# Patient Record
Sex: Male | Born: 1990 | Race: White | Hispanic: No | Marital: Married | Smoking: Never smoker
Health system: Southern US, Community
[De-identification: ages and names within clinical notes are randomized; demographics above are authoritative.]

---
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
## 2010-06-17 ENCOUNTER — Emergency Department (HOSPITAL_BASED_OUTPATIENT_CLINIC_OR_DEPARTMENT_OTHER): Admission: EM | Admit: 2010-06-17 | Discharge: 2010-06-17 | Payer: Self-pay | Admitting: Emergency Medicine

## 2017-12-05 DIAGNOSIS — M67431 Ganglion, right wrist: Secondary | ICD-10-CM | POA: Diagnosis not present

## 2017-12-16 DIAGNOSIS — M25731 Osteophyte, right wrist: Secondary | ICD-10-CM | POA: Diagnosis not present

## 2017-12-16 DIAGNOSIS — R52 Pain, unspecified: Secondary | ICD-10-CM | POA: Diagnosis not present

## 2017-12-25 DIAGNOSIS — J069 Acute upper respiratory infection, unspecified: Secondary | ICD-10-CM | POA: Diagnosis not present

## 2018-01-30 DIAGNOSIS — H9190 Unspecified hearing loss, unspecified ear: Secondary | ICD-10-CM | POA: Diagnosis not present

## 2018-03-23 ENCOUNTER — Emergency Department (HOSPITAL_COMMUNITY)
Admission: EM | Admit: 2018-03-23 | Discharge: 2018-03-24 | Disposition: A | Discharge status: Home / Self Care | Payer: 59 | Attending: Emergency Medicine | Admitting: Emergency Medicine

## 2018-03-23 ENCOUNTER — Encounter (HOSPITAL_COMMUNITY): Payer: Self-pay | Admitting: *Deleted

## 2018-03-23 DIAGNOSIS — Y9389 Activity, other specified: Secondary | ICD-10-CM | POA: Diagnosis not present

## 2018-03-23 DIAGNOSIS — S61213A Laceration without foreign body of left middle finger without damage to nail, initial encounter: Secondary | ICD-10-CM

## 2018-03-23 DIAGNOSIS — Y999 Unspecified external cause status: Secondary | ICD-10-CM | POA: Insufficient documentation

## 2018-03-23 DIAGNOSIS — Y9289 Other specified places as the place of occurrence of the external cause: Secondary | ICD-10-CM | POA: Diagnosis not present

## 2018-03-23 DIAGNOSIS — W260XXA Contact with knife, initial encounter: Secondary | ICD-10-CM | POA: Diagnosis not present

## 2018-03-23 MED ORDER — TETANUS-DIPHTH-ACELL PERTUSSIS 5-2.5-18.5 LF-MCG/0.5 IM SUSP
0.5000 mL | Freq: Once | INTRAMUSCULAR | Status: AC
  Administered 2018-03-23: 0.5 mL via INTRAMUSCULAR
  Filled 2018-03-23: qty 0.5

## 2018-03-23 MED ORDER — LIDOCAINE HCL (PF) 1 % IJ SOLN
5.0000 mL | Freq: Once | INTRAMUSCULAR | Status: AC
  Administered 2018-03-23: 5 mL via INTRADERMAL
  Filled 2018-03-23: qty 30

## 2018-03-23 NOTE — ED Notes (Signed)
Bed: WA06 Expected date:  Expected time:  Means of arrival:  Comments: 

## 2018-03-23 NOTE — ED Triage Notes (Signed)
Pt attempting to open a package with pocketknife & presents with laceration to left hand, 3rd digit, bleeding controlled.

## 2018-03-23 NOTE — ED Provider Notes (Signed)
[facility]-EMERGENCY DEPT Provider Note   CSN: [id number] Arrival date & time: 03/23/18  2108     History   Chief Complaint Chief Complaint  Patient presents with  . Laceration    left hand, 3rd digit    HPI NORAH FICK is a 27 y.o. male.  The history is provided by the patient.  Laceration       27 y.o. male here with laceration to left middle finger.  States he was trying to open a package with a pocket knife and slipped cutting the distal end of his finger.  Bleeding controlled on arrival.  Denies any numbness or weakness of affected digit.  Date of last tetanus unknown.  History reviewed. No pertinent past medical history.  There are no active problems to display for this patient.   History reviewed. No pertinent surgical history.      Home Medications    Prior to Admission medications   Not on File    Family History No family history on file.  Social History Social History   Tobacco Use  . Smoking status: Never Smoker  Substance Use Topics  . Alcohol use: Yes    Comment: ocassionaly  . Drug use: Not Currently     Allergies   Patient has no allergy information on record.   Review of Systems Review of Systems  Skin: Positive for wound.  All other systems reviewed and are negative.    Physical Exam Updated Vital Signs BP 121/67 (BP Location: Right Arm)   Pulse 66   Temp 97.9 F (36.6 C) (Oral)   Resp 14   Ht 5\' 7"  (1.702 m)   Wt 54.4 kg (120 lb)   SpO2 100%   BMI 18.79 kg/m   Physical Exam  Constitutional: He is oriented to person, place, and time. He appears well-developed and well-nourished.  HENT:  Head: Normocephalic and atraumatic.  Mouth/Throat: Oropharynx is clear and moist.  Eyes: Pupils are equal, round, and reactive to light. Conjunctivae and EOM are normal.  Neck: Normal range of motion.  Cardiovascular: Normal rate, regular rhythm and normal heart sounds.  Pulmonary/Chest: Effort  normal and breath sounds normal.  Abdominal: Soft. Bowel sounds are normal.  Musculoskeletal: Normal range of motion.  3cm laceration to distal tip of left middle finger; no nail involvement; full ROM of finger; no deep tissue, vessel, or tendon involvement; normal cap refill and distal sensation  Neurological: He is alert and oriented to person, place, and time.  Skin: Skin is warm and dry.  Psychiatric: He has a normal mood and affect.  Nursing note and vitals reviewed.    ED Treatments / Results  Labs (all labs ordered are listed, but only abnormal results are displayed) Labs Reviewed - No data to display  EKG None  Radiology No results found.  Procedures Procedures (including critical care time)  LACERATION REPAIR Performed by: Garlon Hatchet Authorized by: Garlon Hatchet Consent: Verbal consent obtained. Risks and benefits: risks, benefits and alternatives were discussed Consent given by: patient Patient identity confirmed: provided demographic data Prepped and Draped in normal sterile fashion Wound explored  Laceration Location: distal left middle finger2  Laceration Length: 3 cm  No Foreign Bodies seen or palpated  Anesthesia: local infiltration  Local anesthetic: lidocaine 1% without epinephrine  Anesthetic total: 3 ml  Irrigation method: syringe Amount of cleaning: standard  Skin closure: 5-0 prolene  Number of sutures: 3  Technique: simple interrupted  Patient tolerance: Patient  tolerated the procedure well with no immediate complications.   Medications Ordered in ED Medications  lidocaine (PF) (XYLOCAINE) 1 % injection 5 mL (5 mLs Intradermal Given by Other 03/23/18 2359)  Tdap (BOOSTRIX) injection 0.5 mL (0.5 mLs Intramuscular Given 03/23/18 2359)     Initial Impression / Assessment and Plan / ED Course  I have reviewed the triage vital signs and the nursing notes.  Pertinent labs & imaging results that were available during my care of the  patient were reviewed by me and considered in my medical decision making (see chart for details).  27 year old male here with 3 cm laceration of left distal middle finger from pocket knife.  Bleeding well controlled on arrival.  Finger remains neurovascularly intact with full range of motion.  There is no evidence of deep tissue, vessel, or tendon involvement.  Tetanus was updated.  Laceration repaired as above, patient tolerated well.  Discussed home wound care.  Follow-up for suture removal in 1 week.  Return precautions given for any new/acute changes.  Final Clinical Impressions(s) / ED Diagnoses   Final diagnoses:  Laceration of left middle finger without foreign body without damage to nail, initial encounter    ED Discharge Orders    None       Garlon HatchetSanders, Aryan Bello M, PA-C 03/24/18 0021    Gilda CreasePollina, Christopher J, MD 03/24/18 [phone]

## 2018-03-24 ENCOUNTER — Other Ambulatory Visit: Payer: Self-pay

## 2018-03-24 NOTE — Discharge Instructions (Signed)
Keep sutures clean and dry. Follow-up with your primary care doctor in 1 week to have them removed. Return here for any new/acute changes.

## 2018-05-26 DIAGNOSIS — M546 Pain in thoracic spine: Secondary | ICD-10-CM | POA: Diagnosis not present

## 2018-06-02 DIAGNOSIS — M79604 Pain in right leg: Secondary | ICD-10-CM | POA: Diagnosis not present

## 2018-06-02 DIAGNOSIS — M545 Low back pain: Secondary | ICD-10-CM | POA: Diagnosis not present

## 2018-06-02 DIAGNOSIS — M546 Pain in thoracic spine: Secondary | ICD-10-CM | POA: Diagnosis not present

## 2018-06-04 DIAGNOSIS — M79604 Pain in right leg: Secondary | ICD-10-CM | POA: Diagnosis not present

## 2018-06-04 DIAGNOSIS — M545 Low back pain: Secondary | ICD-10-CM | POA: Diagnosis not present

## 2018-06-04 DIAGNOSIS — M546 Pain in thoracic spine: Secondary | ICD-10-CM | POA: Diagnosis not present

## 2018-06-09 DIAGNOSIS — M79604 Pain in right leg: Secondary | ICD-10-CM | POA: Diagnosis not present

## 2018-06-09 DIAGNOSIS — M545 Low back pain: Secondary | ICD-10-CM | POA: Diagnosis not present

## 2018-06-09 DIAGNOSIS — M546 Pain in thoracic spine: Secondary | ICD-10-CM | POA: Diagnosis not present

## 2018-09-26 DIAGNOSIS — R05 Cough: Secondary | ICD-10-CM | POA: Diagnosis not present

## 2018-11-18 DIAGNOSIS — J1189 Influenza due to unidentified influenza virus with other manifestations: Secondary | ICD-10-CM | POA: Diagnosis not present

## 2020-05-10 ENCOUNTER — Emergency Department (HOSPITAL_BASED_OUTPATIENT_CLINIC_OR_DEPARTMENT_OTHER): Admission: EM | Admit: 2020-05-10 | Discharge: 2020-05-10 | Discharge status: Absent without leave | Payer: 59

## 2023-11-23 ENCOUNTER — Encounter (HOSPITAL_COMMUNITY): Payer: Self-pay

## 2023-11-23 ENCOUNTER — Other Ambulatory Visit: Payer: Self-pay

## 2023-11-23 ENCOUNTER — Emergency Department (HOSPITAL_COMMUNITY)
Admission: EM | Admit: 2023-11-23 | Discharge: 2023-11-23 | Disposition: A | Discharge status: Home / Self Care | Payer: BC Managed Care – PPO | Attending: Emergency Medicine | Admitting: Emergency Medicine

## 2023-11-23 DIAGNOSIS — K0401 Reversible pulpitis: Secondary | ICD-10-CM | POA: Diagnosis not present

## 2023-11-23 DIAGNOSIS — K0889 Other specified disorders of teeth and supporting structures: Secondary | ICD-10-CM | POA: Diagnosis present

## 2023-11-23 MED ORDER — CLINDAMYCIN HCL 150 MG PO CAPS: 450.0000 mg | ORAL_CAPSULE | Freq: Three times a day (TID) | ORAL | 0 refills | Status: AC

## 2023-11-23 MED ORDER — OXYCODONE-ACETAMINOPHEN 5-325 MG PO TABS: 1.0000 | ORAL_TABLET | Freq: Three times a day (TID) | ORAL | 0 refills | Status: AC | PRN

## 2023-11-23 MED ORDER — OXYCODONE-ACETAMINOPHEN 5-325 MG PO TABS
1.0000 | ORAL_TABLET | Freq: Once | ORAL | Status: AC
  Administered 2023-11-23: 1 via ORAL
  Filled 2023-11-23: qty 1

## 2023-11-23 NOTE — ED Triage Notes (Signed)
Right sided dental pain with some swelling that started a few days ago. Pt states he went to the dentist earlier this week and they didn't see any abnormalities.

## 2023-11-23 NOTE — ED Provider Notes (Signed)
[place] EMERGENCY DEPARTMENT AT [facility] Provider Note   CSN: [id number] Arrival date & time: 11/23/23  1255     History  Chief Complaint  Patient presents with   Dental Pain    Larry Alvarado is a 33 y.o. male with no significant past medical history presents the ED today for dental pain.  Reports that he has a crown on one of his right bottom molars.  He states that he feels pain deep in the jaw at that area.  He was evaluated at his dentist earlier this week and they told him that his tooth was broken and sent him home.  He states that the pain has increased since the onset.  Denies any fevers.  He is able to eat with outside of his mouth.  Denies any swelling of the mouth or throat.  Able to open and close his mouth.    Home Medications Prior to Admission medications   Medication Sig Start Date End Date Taking? Authorizing Provider  clindamycin (CLEOCIN) 150 MG capsule Take 3 capsules (450 mg total) by mouth 3 (three) times daily for 7 days. 11/23/23 11/30/23 Yes Maxwell Marion, PA-C  oxyCODONE-acetaminophen (PERCOCET/ROXICET) 5-325 MG tablet Take 1 tablet by mouth every 8 (eight) hours as needed for up to 3 days for severe pain (pain score 7-10). 11/23/23 11/26/23 Yes Maxwell Marion, PA-C      Allergies    Patient has no known allergies.    Review of Systems   Review of Systems  HENT:  Positive for dental problem.   All other systems reviewed and are negative.   Physical Exam Updated Vital Signs BP (!) 145/82 (BP Location: Right Arm)   Pulse 76   Temp 98.1 F (36.7 C) (Oral)   Resp 16   Ht 5\' 7"  (1.702 m)   Wt 54.4 kg   SpO2 99%   BMI 18.78 kg/m  Physical Exam Vitals and nursing note reviewed.  Constitutional:      Appearance: Normal appearance.  HENT:     Head: Normocephalic and atraumatic.     Mouth/Throat:     Mouth: Mucous membranes are moist.     Comments: Tenderness to palpation of tooth #32 without gingivitis or abscess present Eyes:      Conjunctiva/sclera: Conjunctivae normal.     Pupils: Pupils are equal, round, and reactive to light.  Cardiovascular:     Rate and Rhythm: Normal rate and regular rhythm.     Pulses: Normal pulses.  Pulmonary:     Effort: Pulmonary effort is normal.     Breath sounds: Normal breath sounds.  Abdominal:     Palpations: Abdomen is soft.     Tenderness: There is no abdominal tenderness.  Skin:    General: Skin is warm and dry.     Findings: No rash.  Neurological:     General: No focal deficit present.     Mental Status: He is alert.  Psychiatric:        Mood and Affect: Mood normal.        Behavior: Behavior normal.    ED Results / Procedures / Treatments   Labs (all labs ordered are listed, but only abnormal results are displayed) Labs Reviewed - No data to display  EKG None  Radiology No results found.  Procedures Procedures: not indicated.   Medications Ordered in ED Medications  oxyCODONE-acetaminophen (PERCOCET/ROXICET) 5-325 MG per tablet 1 tablet (has no administration in time range)    ED  Course/ Medical Decision Making/ A&P                                 Medical Decision Making Risk Prescription drug management.   This patient presents to the ED for concern of dental pain, this involves an extensive number of treatment options, and is a complaint that carries with it a high risk of complications and morbidity.   Differential diagnosis includes: dental fracture, abscess, pulpitis, gingivitis, etc.   Comorbidities  No significant past medical history   Additional History  Additional history obtained from prior records.   Problem List / ED Course / Critical Interventions / Medication Management  Right posterior molar pain for the past week.  No improvement with OTC analgesics. I ordered medications including: Percocet for pain - given prior to discharge I have reviewed the patients home medicines and have made adjustments as  needed. [place] finds with patient.  Prescription for clindamycin sent to the pharmacy.  Several days with the Percocet sent for breakthrough pain if Ibuprofen and Tylenol do not help.  Vies close follow-up with dentist.   Social Determinants of Health  Access to healthcare   Test / Admission - Considered  Patient is stable and safe discharge home. Return precautions provided.       Final Clinical Impression(s) / ED Diagnoses Final diagnoses:  Pulpitis    Rx / DC Orders ED Discharge Orders          Ordered    oxyCODONE-acetaminophen (PERCOCET/ROXICET) 5-325 MG tablet  Every 8 hours PRN        11/23/23 1404    clindamycin (CLEOCIN) 150 MG capsule  3 times daily        11/23/23 1404              Maxwell Marion, PA-C 11/23/23 1521    Terald Sleeper, MD 11/23/23 1556

## 2023-11-23 NOTE — Discharge Instructions (Signed)
As discussed, we are treating you for a dental infection.  Take clindamycin 450mg  3 times a day for the next 7 days.  It will take about 2 to 3 days for antibiotics to kick in.  Please complete full course of medication even if your symptoms resolved prior to completion.  Alternate between ibuprofen and Tylenol every 4 hours for pain and inflammation.  I have sent a prescription of Percocet and that you can take every 8 hours as needed for breakthrough pain.  Percocet is a pain medication, it can cause constipation.  You might want to take MiraLAX to help  facilitate normal bowel movements.  Do not drive or operate heavy machinery while taking this medication, as it can cause drowsiness.  Follow-up with your dentist in the next week for reevaluation.
